# Patient Record
Sex: Male | Born: 1980 | Race: Black or African American | Hispanic: No | Marital: Single | Smoking: Never smoker
Health system: Southern US, Community
[De-identification: ages and names within clinical notes are randomized; demographics above are authoritative.]

## PROBLEM LIST (undated history)

## (undated) DIAGNOSIS — A749 Chlamydial infection, unspecified: Secondary | ICD-10-CM

---

## 2008-03-22 ENCOUNTER — Emergency Department (HOSPITAL_COMMUNITY): Admission: EM | Admit: 2008-03-22 | Discharge: 2008-03-22 | Payer: Self-pay | Admitting: Emergency Medicine

## 2008-04-07 ENCOUNTER — Emergency Department (HOSPITAL_COMMUNITY): Admission: EM | Admit: 2008-04-07 | Discharge: 2008-04-07 | Payer: Self-pay | Admitting: Emergency Medicine

## 2009-05-02 IMAGING — CR DG FINGER THUMB 2+V*L*
3 series · 3 of 3 positions shown · non-contrast
Comparison: None

CLINICAL DATA: Laceration

LEFT THUMB 2+V

[x finger pa left]
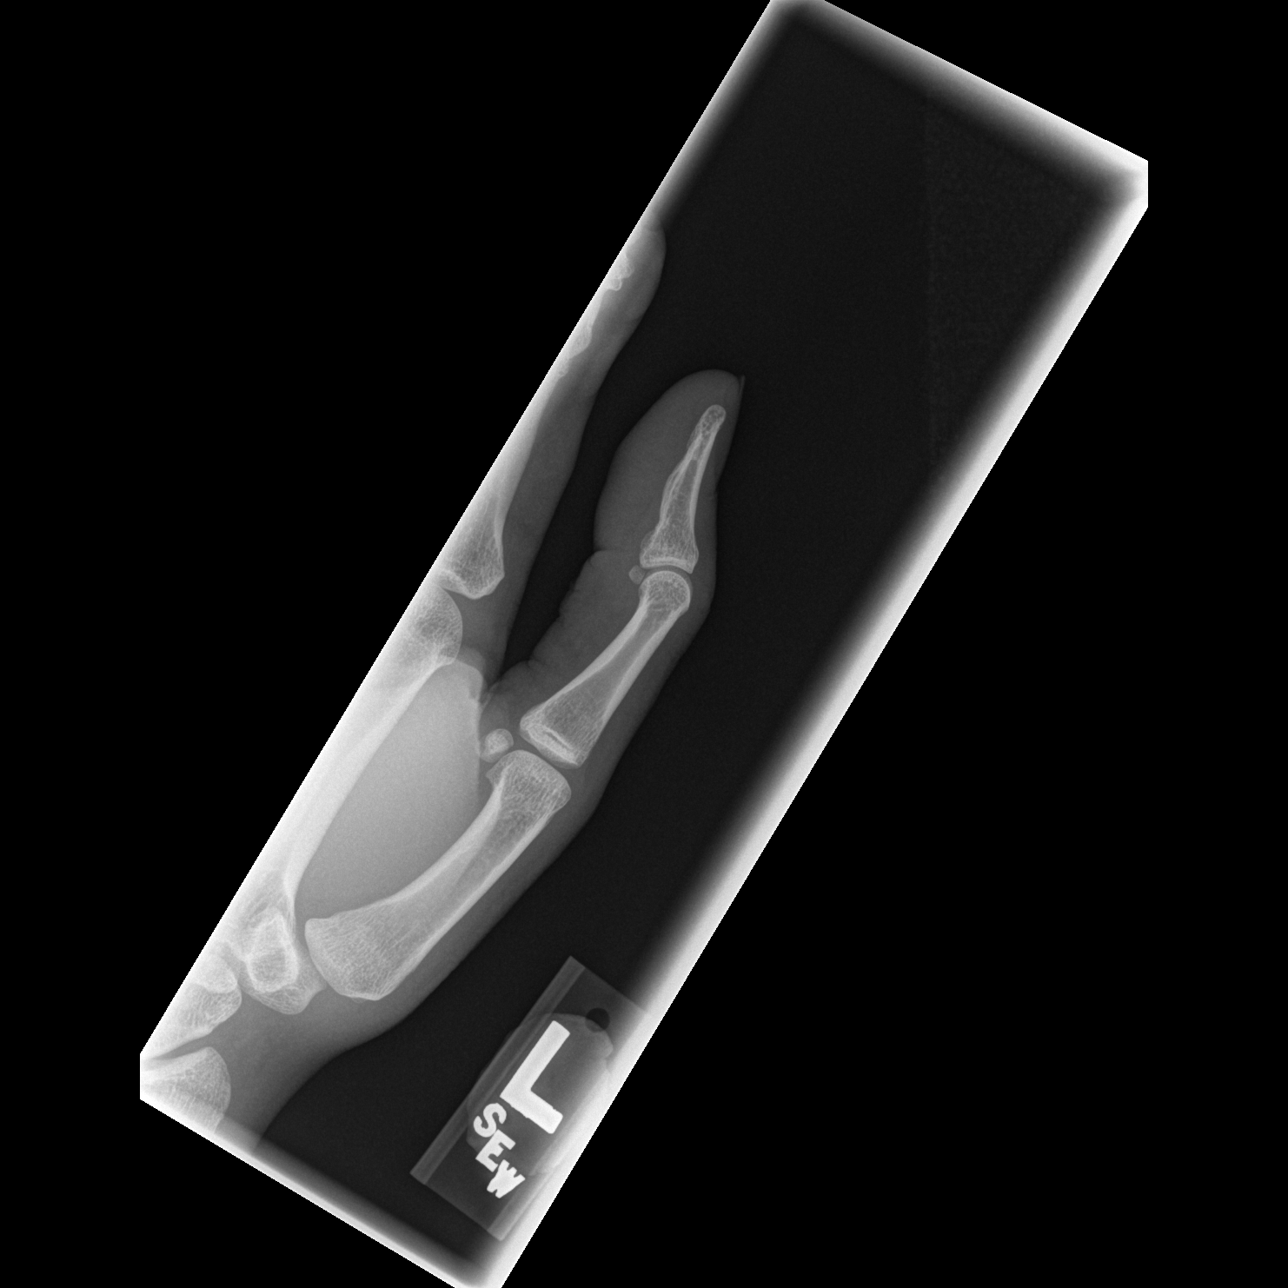

[x finger obl. left]
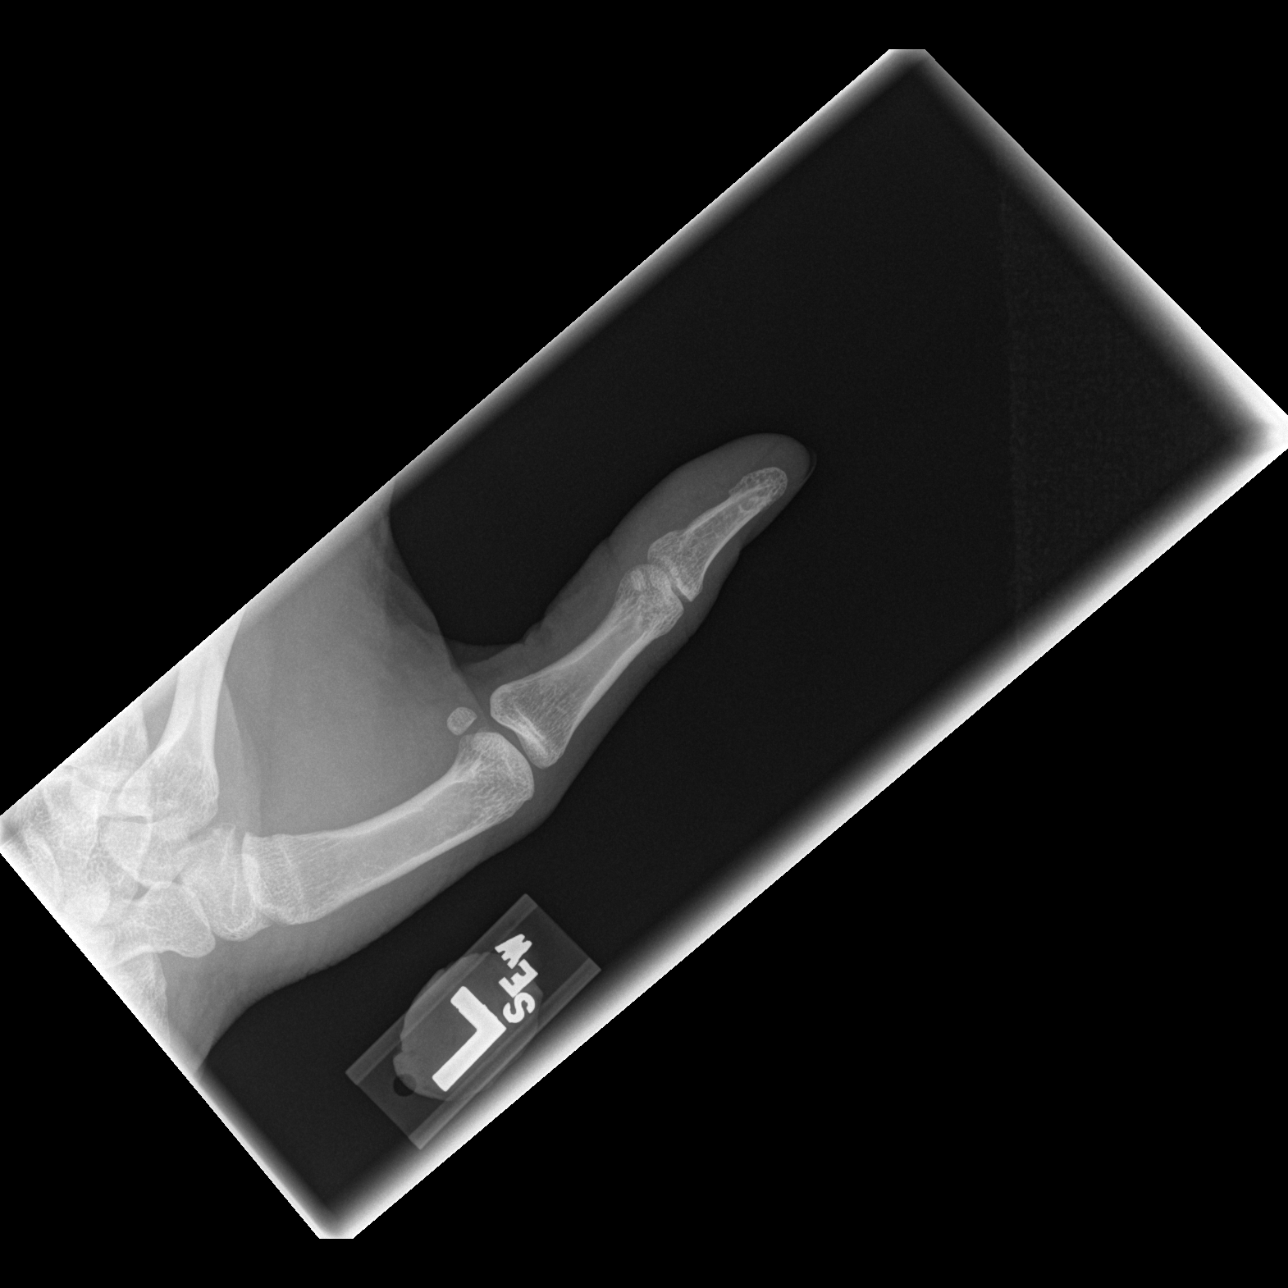

[x finger lateral left]
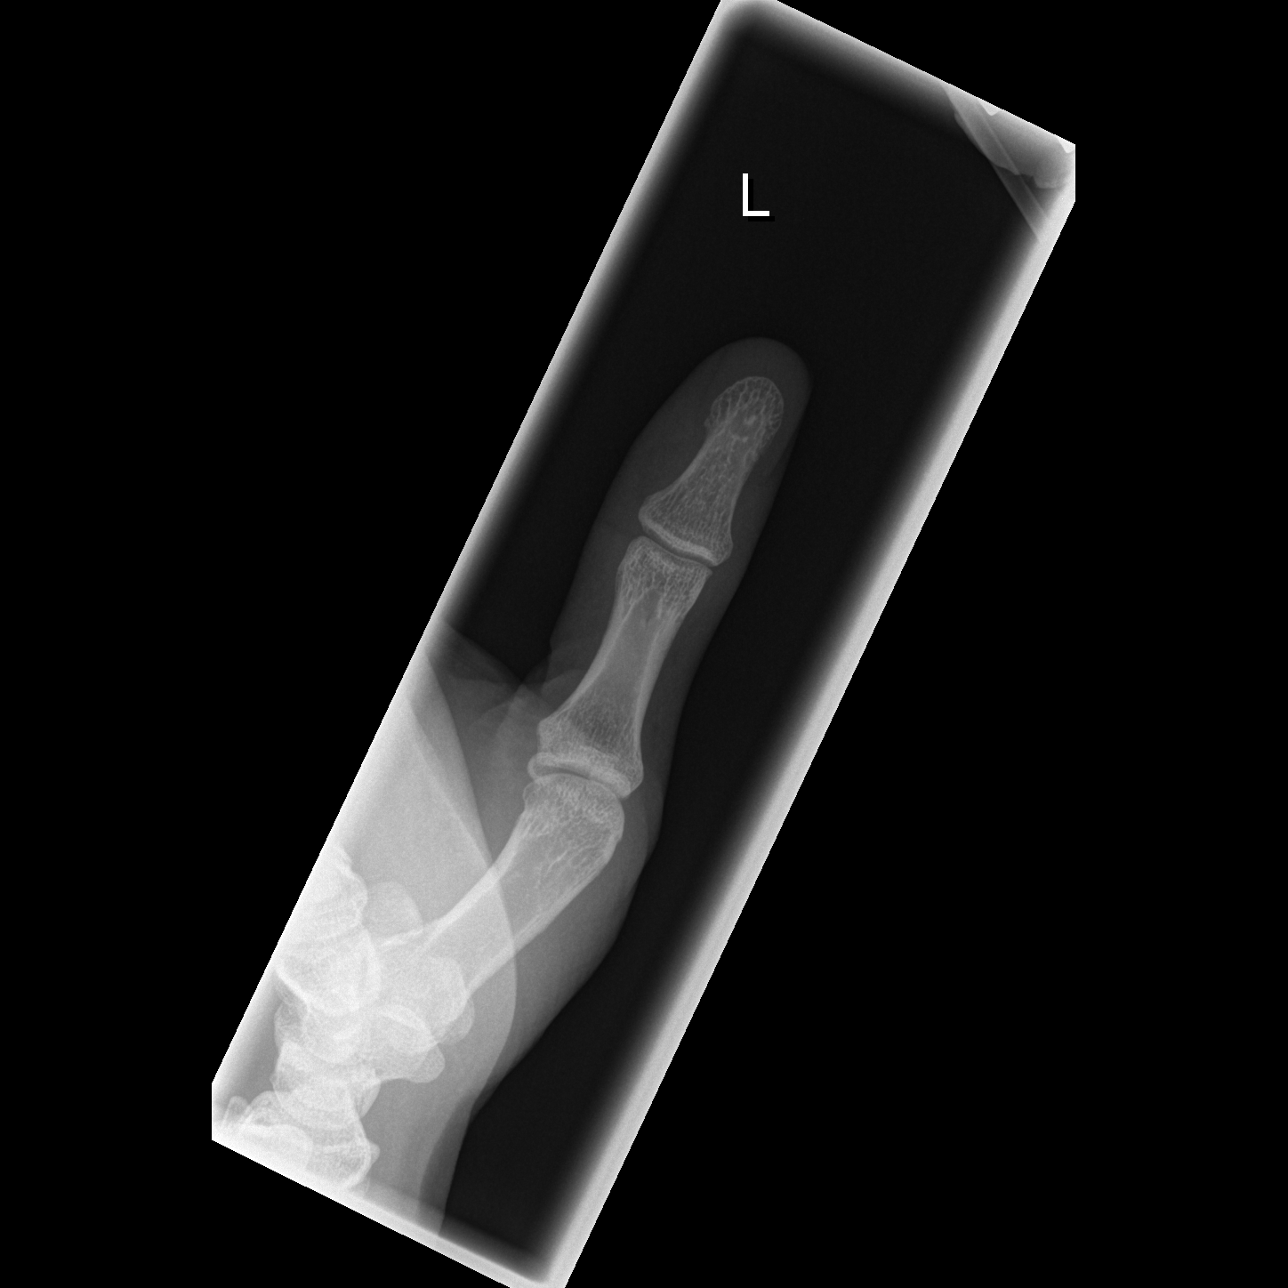

[3 of 3 positions shown; findings below may reference images not displayed]

FINDINGS: No evidence of acute fracture, dislocation or radiopaque
foreign body.
IMPRESSION: No acute osseous findings.

## 2015-05-04 ENCOUNTER — Encounter (HOSPITAL_COMMUNITY): Payer: Self-pay | Admitting: *Deleted

## 2015-05-04 ENCOUNTER — Emergency Department (HOSPITAL_COMMUNITY)
Admission: EM | Admit: 2015-05-04 | Discharge: 2015-05-04 | Disposition: A | Discharge status: Home / Self Care | Payer: Self-pay | Attending: Emergency Medicine | Admitting: Emergency Medicine

## 2015-05-04 DIAGNOSIS — N390 Urinary tract infection, site not specified: Secondary | ICD-10-CM | POA: Insufficient documentation

## 2015-05-04 LAB — URINALYSIS, ROUTINE W REFLEX MICROSCOPIC
BILIRUBIN URINE: NEGATIVE
GLUCOSE, UA: NEGATIVE mg/dL
KETONES UR: NEGATIVE mg/dL
NITRITE: NEGATIVE
PROTEIN: NEGATIVE mg/dL
Specific Gravity, Urine: 1.025 (ref 1.005–1.030)
UROBILINOGEN UA: 0.2 mg/dL (ref 0.0–1.0)
pH: 6 (ref 5.0–8.0)

## 2015-05-04 LAB — URINE MICROSCOPIC-ADD ON

## 2015-05-04 MED ORDER — CIPROFLOXACIN HCL 500 MG PO TABS: 500.0000 mg | ORAL_TABLET | Freq: Two times a day (BID) | ORAL | Status: AC

## 2015-05-04 NOTE — ED Notes (Signed)
Pt reports burning pain with urination. Denies any penile discharge. Denies back or abd pain.

## 2015-05-04 NOTE — Discharge Instructions (Signed)
You were evaluated in the ED today for your burning sensation with urination. Your found to have a UTI. It is important for you to take your medications as prescribed. Please follow-up with primary care to ensure resolution of this infection. Return to ED for worsening symptoms.  Urinary Tract Infection Urinary tract infections (UTIs) can develop anywhere along your urinary tract. Your urinary tract is your body's drainage system for removing wastes and extra water. Your urinary tract includes two kidneys, two ureters, a bladder, and a urethra. Your kidneys are a pair of bean-shaped organs. Each kidney is about the size of your fist. They are located below your ribs, one on each side of your spine. CAUSES Infections are caused by microbes, which are microscopic organisms, including fungi, viruses, and bacteria. These organisms are so small that they can only be seen through a microscope. Bacteria are the microbes that most commonly cause UTIs. SYMPTOMS  Symptoms of UTIs may vary by age and gender of the patient and by the location of the infection. Symptoms in young women typically include a frequent and intense urge to urinate and a painful, burning feeling in the bladder or urethra during urination. Older women and men are more likely to be tired, shaky, and weak and have muscle aches and abdominal pain. A fever may mean the infection is in your kidneys. Other symptoms of a kidney infection include pain in your back or sides below the ribs, nausea, and vomiting. DIAGNOSIS To diagnose a UTI, your caregiver will ask you about your symptoms. Your caregiver also will ask to provide a urine sample. The urine sample will be tested for bacteria and white blood cells. White blood cells are made by your body to help fight infection. TREATMENT  Typically, UTIs can be treated with medication. Because most UTIs are caused by a bacterial infection, they usually can be treated with the use of antibiotics. The choice  of antibiotic and length of treatment depend on your symptoms and the type of bacteria causing your infection. HOME CARE INSTRUCTIONS  If you were prescribed antibiotics, take them exactly as your caregiver instructs you. Finish the medication even if you feel better after you have only taken some of the medication.  Drink enough water and fluids to keep your urine clear or pale yellow.  Avoid caffeine, tea, and carbonated beverages. They tend to irritate your bladder.  Empty your bladder often. Avoid holding urine for long periods of time.  Empty your bladder before and after sexual intercourse.  After a bowel movement, women should cleanse from front to back. Use each tissue only once. SEEK MEDICAL CARE IF:   You have back pain.  You develop a fever.  Your symptoms do not begin to resolve within 3 days. SEEK IMMEDIATE MEDICAL CARE IF:   You have severe back pain or lower abdominal pain.  You develop chills.  You have nausea or vomiting.  You have continued burning or discomfort with urination. MAKE SURE YOU:   Understand these instructions.  Will watch your condition.  Will get help right away if you are not doing well or get worse. Document Released: 07/18/2005 Document Revised: 04/08/2012 Document Reviewed: 11/16/2011 [facility] Patient Information 2015 [employer], [place]. This information is not intended to replace advice given to you by your health care provider. Make sure you discuss any questions you have with your health care provider.

## 2015-05-04 NOTE — ED Provider Notes (Signed)
CSN: [id number]     Arrival date & time 05/04/15  1506 History   This chart was scribed for non-physician practitioner Joycie Peek, PA-C working with Raeford Razor, MD by Lyndel Safe, ED Scribe. This patient was seen in room TR06C/TR06C and the patient's care was started at 4:12 PM.    Chief Complaint  Patient presents with  . Dysuria   The history is provided by the patient. No language interpreter was used.    HPI Comments: Lucas Richard is a 34 y.o. male who presents to the Emergency Department complaining of sudden onset, intermittent, moderate dysuria. Reports one episode of dysuria earlier today. Pt is sexually active with females only and use protection occasionally. He reports no new recent sexual partners. He notes upon urinating in the ED the dysuria was not as prominent as before. Denies penile discharge, pain or swelling, hematuria, testicular pain or swelling, fevers, chills, nausea, vomiting, back pain, or abdominal pain.   History reviewed. No pertinent past medical history. History reviewed. No pertinent past surgical history. History reviewed. No pertinent family history. History  Substance Use Topics  . Smoking status: Not on file  . Smokeless tobacco: Not on file  . Alcohol Use: No    Review of Systems  Constitutional: Negative for fever and chills.  Gastrointestinal: Negative for nausea, vomiting and abdominal pain.  Genitourinary: Positive for dysuria. Negative for discharge, penile swelling, scrotal swelling, genital sores, penile pain and testicular pain.  Musculoskeletal: Negative for back pain.  All other systems reviewed and are negative.   Allergies  Review of patient's allergies indicates no known allergies.  Home Medications   Prior to Admission medications   Medication Sig Start Date End Date Taking? Authorizing Provider  ciprofloxacin (CIPRO) 500 MG tablet Take 1 tablet (500 mg total) by mouth 2 (two) times daily. One po bid x 7 days 05/04/15    Joycie Peek, PA-C   BP 108/73 mmHg  Pulse 80  Temp(Src) 98.7 F (37.1 C) (Oral)  Resp 18  Ht  (1.88 m)  Wt 174 lb 1.6 oz (78.971 kg)  BMI 22.34 kg/m2  SpO2 97% Physical Exam  Constitutional: He is oriented to person, place, and time. He appears well-developed and well-nourished. No distress.  HENT:  Head: Normocephalic.  Right Ear: External ear normal.  Left Ear: External ear normal.  Mouth/Throat: No oropharyngeal exudate.  Eyes: Pupils are equal, round, and reactive to light. Right eye exhibits no discharge. Left eye exhibits no discharge. No scleral icterus.  Neck: No JVD present.  Cardiovascular: Normal rate, regular rhythm and normal heart sounds.   Pulmonary/Chest: Effort normal and breath sounds normal. No respiratory distress. He has no wheezes. He has no rales.  Lungs clear to auscultation bilaterally.   Abdominal: Soft. Bowel sounds are normal. There is no tenderness.  Genitourinary: Penis normal. No penile tenderness.  Scrotum normal.   Musculoskeletal: He exhibits no edema.  Lymphadenopathy:    He has no cervical adenopathy.  Neurological: He is alert and oriented to person, place, and time.  Skin: Skin is warm. No rash noted. No erythema. No pallor.  Psychiatric: He has a normal mood and affect. His behavior is normal.  Nursing note and vitals reviewed.   ED Course  Procedures  DIAGNOSTIC STUDIES: Oxygen Saturation is 97% on RA, normal by my interpretation.    COORDINATION OF CARE: 4:15 PM Discussed treatment plan which includes to order diagnostic labs with pt. Pt acknowledges and agrees to plan.   Labs  Review Labs Reviewed  URINALYSIS, ROUTINE W REFLEX MICROSCOPIC (NOT AT [facility]) - Abnormal; Notable for the following:    Hgb urine dipstick SMALL (*)    Leukocytes, UA SMALL (*)    All other components within normal limits  URINE MICROSCOPIC-ADD ON - Abnormal; Notable for the following:    Squamous Epithelial / LPF FEW (*)    Bacteria, UA FEW  (*)    All other components within normal limits    Imaging Review No results found.   EKG Interpretation None     Meds given in ED:  Medications - No data to display  Discharge Medication List as of 05/04/2015  5:27 PM    START taking these medications   Details  ciprofloxacin (CIPRO) 500 MG tablet Take 1 tablet (500 mg total) by mouth 2 (two) times daily. One po bid x 7 days, Starting 05/04/2015, Until Discontinued, Print       .vitaslsm  MDM  Vitals stable - WNL -afebrile Pt resting comfortably in ED. PE--physical exam is largely unknown concerning. Benign abdominal exam. Benign genitourinary exam. Labwork--evidence of UTI on urinalysis.  DDX--patient with UTI, will treat empirically. No evidence of other systemic infection. Denies any other genitourinary complaints or any homosexual activity. Will DC with Cipro. Encourage follow-up with PCP to ensure resolution. I discussed all relevant lab findings and imaging results with pt and they verbalized understanding. Discussed f/u with PCP within 48 hrs and return precautions, pt very amenable to plan.  Final diagnoses:  UTI (lower urinary tract infection)   I personally performed the services described in this documentation, which was scribed in my presence. The recorded information has been reviewed and is accurate.    Joycie PeekBenjamin Rain Wil[place], PA-C 05/04/15 2350  Gilda Creasehristopher J Pollina, MD 05/05/15 [phone]

## 2015-05-10 ENCOUNTER — Encounter (HOSPITAL_COMMUNITY): Payer: Self-pay | Admitting: Emergency Medicine

## 2015-05-10 ENCOUNTER — Emergency Department (HOSPITAL_COMMUNITY)
Admission: EM | Admit: 2015-05-10 | Discharge: 2015-05-10 | Disposition: A | Discharge status: Home / Self Care | Payer: Self-pay | Attending: Emergency Medicine | Admitting: Emergency Medicine

## 2015-05-10 DIAGNOSIS — R300 Dysuria: Secondary | ICD-10-CM | POA: Insufficient documentation

## 2015-05-10 DIAGNOSIS — R369 Urethral discharge, unspecified: Secondary | ICD-10-CM | POA: Insufficient documentation

## 2015-05-10 LAB — URINALYSIS, ROUTINE W REFLEX MICROSCOPIC
Bilirubin Urine: NEGATIVE
Glucose, UA: NEGATIVE mg/dL
Ketones, ur: 15 mg/dL — AB
Nitrite: NEGATIVE
PH: 5.5 (ref 5.0–8.0)
PROTEIN: NEGATIVE mg/dL
Specific Gravity, Urine: 1.025 (ref 1.005–1.030)
Urobilinogen, UA: 0.2 mg/dL (ref 0.0–1.0)

## 2015-05-10 LAB — URINE MICROSCOPIC-ADD ON

## 2015-05-10 MED ORDER — AZITHROMYCIN 250 MG PO TABS
1000.0000 mg | ORAL_TABLET | Freq: Once | ORAL | Status: AC
  Administered 2015-05-10: 1000 mg via ORAL
  Filled 2015-05-10: qty 4

## 2015-05-10 MED ORDER — CEFTRIAXONE SODIUM 250 MG IJ SOLR
250.0000 mg | Freq: Once | INTRAMUSCULAR | Status: AC
  Administered 2015-05-10: 250 mg via INTRAMUSCULAR
  Filled 2015-05-10: qty 250

## 2015-05-10 MED ORDER — DOXYCYCLINE HYCLATE 100 MG PO CAPS: 100.0000 mg | ORAL_CAPSULE | Freq: Two times a day (BID) | ORAL | Status: AC

## 2015-05-10 NOTE — ED Notes (Signed)
Pt was treated recently for UTI now pain is worse with urination and having penial discharge x 3 days.

## 2015-05-10 NOTE — Discharge Instructions (Signed)

## 2015-05-10 NOTE — ED Provider Notes (Signed)
CSN: [id number]     Arrival date & time 05/10/15  1547 History  This chart was scribed for Fayrene HelperBowie Athelene Hursey, PA-C, working with Toy CookeyMegan Docherty, MD by Octavia HeirArianna Nassar, ED Scribe. This patient was seen in room TR09C/TR09C and the patient's care was started at 4:50 PM.    Chief Complaint  Patient presents with  . Penile Discharge      The history is provided by the patient. No language interpreter was used.   HPI Comments: Lucas Richard is a 34 y.o. male who presents to the Emergency Department complaining of constant, gradual worsenign penile discharge and dysuria onset a 2 days ago. Pt reports having a cloudy yellow discharge on his boxers and reports severe burning while urination. He states he is sexually active with one partner, does not use protections, and is unknown if his partner is sleeping with more than one person. Pt was seen one week ago and was dx with a UTI but feels this is different. Pt also notes being diagnosed with chlamydia in the past. He denies fever, back pain, hematuria, abdominal pain, hx of HIV.   History reviewed. No pertinent past medical history. History reviewed. No pertinent past surgical history. No family history on file. History  Substance Use Topics  . Smoking status: Never Smoker   . Smokeless tobacco: Not on file  . Alcohol Use: No    Review of Systems  Constitutional: Negative for fever.  Gastrointestinal: Negative for abdominal pain.  Genitourinary: Positive for dysuria and discharge.  Musculoskeletal: Negative for back pain.      Allergies  Review of patient's allergies indicates no known allergies.  Home Medications   Prior to Admission medications   Medication Sig Start Date End Date Taking? Authorizing Provider  ciprofloxacin (CIPRO) 500 MG tablet Take 1 tablet (500 mg total) by mouth 2 (two) times daily. One po bid x 7 days Patient not taking: Reported on 05/10/2015 05/04/15   Joycie PeekBenjamin Cartner, PA-C   Triage vitals: BP 143/88 mmHg  Pulse 59   Temp(Src) 98.9 F (37.2 C) (Oral)  Resp 16  Ht 6' (1.829 m)  Wt 174 lb (78.926 kg)  BMI 23.59 kg/m2  SpO2 100% Physical Exam  Constitutional: He appears well-developed and well-nourished. No distress.  HENT:  Head: Normocephalic and atraumatic.  Eyes: Conjunctivae are normal. Pupils are equal, round, and reactive to light. Right eye exhibits no discharge. Left eye exhibits no discharge.  Neck: Neck supple.  Cardiovascular: Normal rate, regular rhythm, normal heart sounds and intact distal pulses.   Pulmonary/Chest: Effort normal and breath sounds normal. No respiratory distress.  Abdominal: Soft. There is no tenderness.  Genitourinary:  Inguinal lymphadenopathy noticed Testicle non tender to palpation No inguinal hernia, scrotum normal appearing  Lymphadenopathy:    He has no cervical adenopathy.  Neurological: He is alert. Coordination normal.  Skin: Skin is warm and dry. No rash noted. He is not diaphoretic.  Psychiatric: He has a normal mood and affect. His behavior is normal.  Nursing note and vitals reviewed.   ED Course  Procedures  DIAGNOSTIC STUDIES: Oxygen Saturation is 100% on RA, normal by my interpretation.  COORDINATION OF CARE: 4:52 PM Discussed treatment plan which includes urinalysis, blood work, and antibiotic with pt at bedside and pt agreed to plan.    Labs Review Labs Reviewed  URINALYSIS, ROUTINE W REFLEX MICROSCOPIC (NOT AT [facility]) - Abnormal; Notable for the following:    Hgb urine dipstick TRACE (*)    Ketones, ur 15 (*)  Leukocytes, UA SMALL (*)    All other components within normal limits  URINE MICROSCOPIC-ADD ON  HIV ANTIBODY (ROUTINE TESTING)  GC/CHLAMYDIA PROBE AMP ([place]) NOT AT [facility]    Imaging Review No results found.   EKG Interpretation None      MDM   Final diagnoses:  Penile discharge   BP 143/88 mmHg  Pulse 59  Temp(Src) 98.9 F (37.2 C) (Oral)  Resp 16  Ht 6' (1.829 m)  Wt 174 lb (78.926 kg)  BMI 23.59  kg/m2  SpO2 100%   I personally performed the services described in this documentation, which was scribed in my presence. The recorded information has been reviewed and is accurate.    Fayrene Helper, PA-C 05/10/15 1854  Vanetta Mulders, MD 05/11/15 [phone]

## 2015-05-10 NOTE — ED Notes (Signed)
Pt A&OX4, ambulatory at d/c with steady gait, NAD 

## 2015-05-11 LAB — GC/CHLAMYDIA PROBE AMP (~~LOC~~) NOT AT ARMC
CHLAMYDIA, DNA PROBE: POSITIVE — AB
NEISSERIA GONORRHEA: NEGATIVE

## 2015-05-11 LAB — HIV ANTIBODY (ROUTINE TESTING W REFLEX): HIV SCREEN 4TH GENERATION: NONREACTIVE

## 2015-05-11 NOTE — Care Management (Signed)
ED CM received call from patient querying his prescription and quantity. CM reviewed record and verified prescription with patient, he was instructed to have pharmacy  contact ED with any questions.. Patient verbalized understanding,.No further ED CM needs identified.

## 2015-05-12 ENCOUNTER — Telehealth: Payer: Self-pay | Admitting: Emergency Medicine

## 2015-05-12 NOTE — Telephone Encounter (Signed)
Positive Chlamydia culture Treated with Rocephin and Zithromax [facility] faxed  Unable to contact patient by phone, letter sent.

## 2015-06-13 ENCOUNTER — Telehealth (HOSPITAL_COMMUNITY): Payer: Self-pay

## 2015-06-13 NOTE — Telephone Encounter (Signed)
Unable to contact pt by mail or telephone. Unable to communicate lab results or treatment changes. 

## 2015-09-07 ENCOUNTER — Encounter (HOSPITAL_COMMUNITY): Payer: Self-pay | Admitting: Emergency Medicine

## 2015-09-07 ENCOUNTER — Emergency Department (HOSPITAL_COMMUNITY)
Admission: EM | Admit: 2015-09-07 | Discharge: 2015-09-07 | Disposition: A | Discharge status: Home / Self Care | Payer: Self-pay | Attending: Emergency Medicine | Admitting: Emergency Medicine

## 2015-09-07 DIAGNOSIS — A64 Unspecified sexually transmitted disease: Secondary | ICD-10-CM | POA: Insufficient documentation

## 2015-09-07 MED ORDER — CEFTRIAXONE SODIUM 250 MG IJ SOLR
250.0000 mg | Freq: Once | INTRAMUSCULAR | Status: AC
  Administered 2015-09-07: 250 mg via INTRAMUSCULAR
  Filled 2015-09-07: qty 250

## 2015-09-07 MED ORDER — AZITHROMYCIN 250 MG PO TABS
1000.0000 mg | ORAL_TABLET | Freq: Once | ORAL | Status: AC
  Administered 2015-09-07: 1000 mg via ORAL
  Filled 2015-09-07: qty 4

## 2015-09-07 NOTE — Discharge Instructions (Signed)
Sexually Transmitted Disease °A sexually transmitted disease (STD) is a disease or infection that may be passed (transmitted) from person to person, usually during sexual activity. This may happen by way of saliva, semen, blood, vaginal mucus, or urine. Common STDs include: °· Gonorrhea. °· Chlamydia. °· Syphilis. °· HIV and AIDS. °· Genital herpes. °· Hepatitis B and C. °· Trichomonas. °· Human papillomavirus (HPV). °· Pubic lice. °· Scabies. °· Mites. °· Bacterial vaginosis. °WHAT ARE CAUSES OF STDs? °An STD may be caused by bacteria, a virus, or parasites. STDs are often transmitted during sexual activity if one person is infected. However, they may also be transmitted through nonsexual means. STDs may be transmitted after:  °· Sexual intercourse with an infected person. °· Sharing sex toys with an infected person. °· Sharing needles with an infected person or using unclean piercing or tattoo needles. °· Having intimate contact with the genitals, mouth, or rectal areas of an infected person. °· Exposure to infected fluids during birth. °WHAT ARE THE SIGNS AND SYMPTOMS OF STDs? °Different STDs have different symptoms. Some people may not have any symptoms. If symptoms are present, they may include: °· Painful or bloody urination. °· Pain in the pelvis, abdomen, vagina, anus, throat, or eyes. °· A skin rash, itching, or irritation. °· Growths, ulcerations, blisters, or sores in the genital and anal areas. °· Abnormal vaginal discharge with or without bad odor. °· Penile discharge in men. °· Fever. °· Pain or bleeding during sexual intercourse. °· Swollen glands in the groin area. °· Yellow skin and eyes (jaundice). This is seen with hepatitis. °· Swollen testicles. °· Infertility. °· Sores and blisters in the mouth. °HOW ARE STDs DIAGNOSED? °To make a diagnosis, your health care provider may: °· Take a medical history. °· Perform a physical exam. °· Take a sample of any discharge to examine. °· Swab the throat,  cervix, opening to the penis, rectum, or vagina for testing. °· Test a sample of your first morning urine. °· Perform blood tests. °· Perform a Pap test, if this applies. °· Perform a colposcopy. °· Perform a laparoscopy. °HOW ARE STDs TREATED? °Treatment depends on the STD. Some STDs may be treated but not cured. °· Chlamydia, gonorrhea, trichomonas, and syphilis can be cured with antibiotic medicine. °· Genital herpes, hepatitis, and HIV can be treated, but not cured, with prescribed medicines. The medicines lessen symptoms. °· Genital warts from HPV can be treated with medicine or by freezing, burning (electrocautery), or surgery. Warts may come back. °· HPV cannot be cured with medicine or surgery. However, abnormal areas may be removed from the cervix, vagina, or vulva. °· If your diagnosis is confirmed, your recent sexual partners need treatment. This is true even if they are symptom-free or have a negative culture or evaluation. They should not have sex until their health care providers say it is okay. °· Your health care provider may test you for infection again 3 months after treatment. °HOW CAN I REDUCE MY RISK OF GETTING AN STD? °Take these steps to reduce your risk of getting an STD: °· Use latex condoms, dental dams, and water-soluble lubricants during sexual activity. Do not use petroleum jelly or oils. °· Avoid having multiple sex partners. °· Do not have sex with someone who has other sex partners °· Do not have sex with anyone you do not know or who is at high risk for an STD. °· Avoid risky sex practices that can break your skin. °· Do not have sex   if you have open sores on your mouth or skin.  Avoid drinking too much alcohol or taking illegal drugs. Alcohol and drugs can affect your judgment and put you in a vulnerable position.  Avoid engaging in oral and anal sex acts.  Get vaccinated for HPV and hepatitis. If you have not received these vaccines in the past, talk to your health care  provider about whether one or both might be right for you.  If you are at risk of being infected with HIV, it is recommended that you take a prescription medicine daily to prevent HIV infection. This is called pre-exposure prophylaxis (PrEP). You are considered at risk if:  You are a man who has sex with other men (MSM).  You are a heterosexual man or woman and are sexually active with more than one partner.  You take drugs by injection.  You are sexually active with a partner who has HIV.  Talk with your health care provider about whether you are at high risk of being infected with HIV. If you choose to begin PrEP, you should first be tested for HIV. You should then be tested every 3 months for as long as you are taking PrEP. WHAT SHOULD I DO IF I THINK I HAVE AN STD?  See your health care provider.  Tell your sexual partner(s). They should be tested and treated for any STDs.  Do not have sex until your health care provider says it is okay. WHEN SHOULD I GET IMMEDIATE MEDICAL CARE? Contact your health care provider right away if:   You have severe abdominal pain.  You are a man and notice swelling or pain in your testicles.  You are a woman and notice swelling or pain in your vagina.   This information is not intended to replace advice given to you by your health care provider. Make sure you discuss any questions you have with your health care provider.   Document Released: 12/29/2002 Document Revised: 10/29/2014 Document Reviewed: 04/28/2013 [employer] Interactive Patient Education [employer]   Please follow-up with the health department in 3-5 days if symptoms persist, you'll receive a phone call regarding test results. Please return immediately if any new or worsening signs or symptoms present.

## 2015-09-07 NOTE — ED Notes (Signed)
Reports penile discharge since yesterday, no dysuria or other complaints, A/O X4, ambulatory and in NAD

## 2015-09-07 NOTE — ED Provider Notes (Signed)
CSN: [id number]     Arrival date & time 09/07/15  8295 History  By signing my name below, I, Essence Howell, attest that this documentation has been prepared under the direction and in the presence of Eyvonne Mechanic, PA-C Electronically Signed: Charline Bills, ED Scribe 09/07/2015 at 10:11 AM.   Chief Complaint  Patient presents with  . Penile Discharge   The history is provided by the patient. No language interpreter was used.   HPI Comments: Lucas Richard is a 34 y.o. male who presents to the Emergency Department complaining of persistent penile discharge onset yesterday. Pt reports sexual intercourse with a male partner who states that she was diagnosed with an UTI. He denies fever, chills, dysuria, abdominal pain, nausea, vomiting, back pain. Pt reports h/o STD.    History reviewed. No pertinent past medical history. History reviewed. No pertinent past surgical history. No family history on file. Social History  Substance Use Topics  . Smoking status: Never Smoker   . Smokeless tobacco: None  . Alcohol Use: No    Review of Systems  All other systems reviewed and are negative.  Allergies  Review of patient's allergies indicates no known allergies.  Home Medications   Prior to Admission medications   Medication Sig Start Date End Date Taking? Authorizing Provider  ciprofloxacin (CIPRO) 500 MG tablet Take 1 tablet (500 mg total) by mouth 2 (two) times daily. One po bid x 7 days Patient not taking: Reported on 05/10/2015 05/04/15   Joycie Peek, PA-C  doxycycline (VIBRAMYCIN) 100 MG capsule Take 1 capsule (100 mg total) by mouth 2 (two) times daily. One po bid x 7 days 05/10/15   Fayrene Helper, PA-C   BP 117/67 mmHg  Pulse 75  Temp(Src) 98.2 F (36.8 C) (Oral)  Resp 18  Ht  (1.88 m)  Wt 185 lb (83.915 kg)  BMI 23.74 kg/m2  SpO2 98% Physical Exam  Constitutional: He is oriented to person, place, and time. He appears well-developed and well-nourished. No distress.   HENT:  Head: Normocephalic and atraumatic.  Eyes: Conjunctivae and EOM are normal.  Neck: Neck supple. No tracheal deviation present.  Cardiovascular: Normal rate.   Pulmonary/Chest: Effort normal. No respiratory distress.  Abdominal: Soft. There is no tenderness. There is no CVA tenderness.  Genitourinary: Testes normal. Uncircumcised. No penile erythema. No discharge found.  Musculoskeletal: Normal range of motion.  Neurological: He is alert and oriented to person, place, and time.  Skin: Skin is warm and dry.  Psychiatric: He has a normal mood and affect. His behavior is normal.  Nursing note and vitals reviewed.  ED Course  Procedures (including critical care time) DIAGNOSTIC STUDIES: Oxygen Saturation is 98% on RA, normal by my interpretation.    COORDINATION OF CARE: 10:04 AM-Discussed treatment plan which includes STD screening with pt at bedside and pt agreed to plan.   Labs Review Labs Reviewed  RPR  HIV ANTIBODY (ROUTINE TESTING)  GC/CHLAMYDIA PROBE AMP ([place]) NOT AT [facility]   Imaging Review No results found. I have personally reviewed and evaluated these images and lab results as part of my medical decision-making.   EKG Interpretation None      MDM   Final diagnoses:  STD (male)   Labs: STD screening  Imaging:  Consults:  Therapeutics: Rocephin and azithromycin  Discharge Meds:    Assessment/Plan: Pt's presentation most consistent with STD. Treated here in the ED with above medications. Pt will be contacting with results. Follow-up with the health  department. If any worsening symptoms present, come back. Pt given strict return precautions and verbalized understanding and agreement to today's plan and had no other concerns at discharge.   I personally performed the services described in this documentation, which was scribed in my presence. The recorded information has been reviewed and is accurate.    Eyvonne MechanicJeffrey Keyona Emrich, PA-C 09/07/15  1014  Gwyneth SproutWhitney Plunkett, MD 09/09/15 0009

## 2015-09-08 LAB — RPR: RPR Ser Ql: NONREACTIVE

## 2015-09-08 LAB — GC/CHLAMYDIA PROBE AMP (~~LOC~~) NOT AT ARMC
Chlamydia: POSITIVE — AB
Neisseria Gonorrhea: NEGATIVE

## 2015-09-08 LAB — HIV ANTIBODY (ROUTINE TESTING W REFLEX): HIV Screen 4th Generation wRfx: NONREACTIVE

## 2015-09-09 ENCOUNTER — Telehealth (HOSPITAL_BASED_OUTPATIENT_CLINIC_OR_DEPARTMENT_OTHER): Payer: Self-pay | Admitting: *Deleted

## 2015-09-10 ENCOUNTER — Telehealth (HOSPITAL_COMMUNITY): Payer: Self-pay

## 2015-09-10 NOTE — Telephone Encounter (Signed)
Positive for chlamydia. Treated per protocol. [employer]HS form faxed. Attempting to contact.  

## 2015-09-11 ENCOUNTER — Telehealth (HOSPITAL_COMMUNITY): Payer: Self-pay

## 2015-09-11 NOTE — Telephone Encounter (Signed)
Unable to reach by telephone. Letter sent to address on record.  

## 2015-09-26 ENCOUNTER — Telehealth (HOSPITAL_COMMUNITY): Payer: Self-pay

## 2015-09-26 NOTE — Telephone Encounter (Signed)
Unable to reach by phone or mail.  Chart closed.   

## 2015-10-08 ENCOUNTER — Encounter (HOSPITAL_COMMUNITY): Payer: Self-pay | Admitting: Emergency Medicine

## 2015-10-08 ENCOUNTER — Emergency Department (HOSPITAL_COMMUNITY)
Admission: EM | Admit: 2015-10-08 | Discharge: 2015-10-08 | Disposition: A | Discharge status: Home / Self Care | Payer: Self-pay | Attending: Emergency Medicine | Admitting: Emergency Medicine

## 2015-10-08 DIAGNOSIS — R369 Urethral discharge, unspecified: Secondary | ICD-10-CM

## 2015-10-08 LAB — URINE MICROSCOPIC-ADD ON
BACTERIA UA: NONE SEEN
RBC / HPF: NONE SEEN RBC/hpf (ref 0–5)
SQUAMOUS EPITHELIAL / LPF: NONE SEEN

## 2015-10-08 LAB — URINALYSIS, ROUTINE W REFLEX MICROSCOPIC
Bilirubin Urine: NEGATIVE
Glucose, UA: NEGATIVE mg/dL
Hgb urine dipstick: NEGATIVE
Ketones, ur: NEGATIVE mg/dL
NITRITE: NEGATIVE
PROTEIN: NEGATIVE mg/dL
SPECIFIC GRAVITY, URINE: 1.019 (ref 1.005–1.030)
pH: 7 (ref 5.0–8.0)

## 2015-10-08 MED ORDER — AZITHROMYCIN 250 MG PO TABS
1000.0000 mg | ORAL_TABLET | Freq: Once | ORAL | Status: AC
  Administered 2015-10-08: 1000 mg via ORAL
  Filled 2015-10-08: qty 4

## 2015-10-08 MED ORDER — LIDOCAINE HCL (PF) 1 % IJ SOLN
INTRAMUSCULAR | Status: AC
  Administered 2015-10-08: 5 mL
  Filled 2015-10-08: qty 5

## 2015-10-08 MED ORDER — CEFTRIAXONE SODIUM 250 MG IJ SOLR
250.0000 mg | Freq: Once | INTRAMUSCULAR | Status: AC
  Administered 2015-10-08: 250 mg via INTRAMUSCULAR
  Filled 2015-10-08: qty 250

## 2015-10-08 NOTE — ED Provider Notes (Signed)
CSN: [id number]     Arrival date & time 10/08/15  [id number] History   First MD Initiated Contact with Patient 10/08/15 [phone]     No chief complaint on file.    (Consider location/radiation/quality/duration/timing/severity/associated sxs/prior Treatment) HPI   34 year old male with prior history of chlamydia infection presenting with complaint of now discharge. Patient states for the past 2 days he has notice and was discharge from penis and appears on his underwear. Mentioned symptoms is similar to prior chlamydial infection in the past. He denies having any fever, abdominal pain, back pain, burning urination, or hematuria. He denies any new rash. He has the same sexual partners within the past 6 months, not using protection. No prior history of HIV.  No past medical history on file. No past surgical history on file. No family history on file. Social History  Substance Use Topics  . Smoking status: Never Smoker   . Smokeless tobacco: Not on file  . Alcohol Use: No    Review of Systems  Constitutional: Negative for fever.  Genitourinary: Positive for discharge. Negative for testicular pain.      Allergies  Review of patient's allergies indicates no known allergies.  Home Medications   Prior to Admission medications   Medication Sig Start Date End Date Taking? Authorizing Provider  ciprofloxacin (CIPRO) 500 MG tablet Take 1 tablet (500 mg total) by mouth 2 (two) times daily. One po bid x 7 days Patient not taking: Reported on 05/10/2015 05/04/15   Joycie PeekBenjamin Cartner, PA-C  doxycycline (VIBRAMYCIN) 100 MG capsule Take 1 capsule (100 mg total) by mouth 2 (two) times daily. One po bid x 7 days 05/10/15   Fayrene HelperBowie Allissa Albright, PA-C   There were no vitals taken for this visit. Physical Exam  Constitutional: He appears well-developed and well-nourished. No distress.  HENT:  Head: Atraumatic.  Eyes: Conjunctivae are normal.  Neck: Neck supple.  Abdominal: Hernia confirmed negative in the right  inguinal area and confirmed negative in the left inguinal area.  Genitourinary: Testes normal and penis normal. Cremasteric reflex is present. Circumcised. No penile tenderness. No discharge found.  Chaperone present during exam  Lymphadenopathy:       Right: No inguinal adenopathy present.       Left: No inguinal adenopathy present.  Neurological: He is alert.  Skin: No rash noted.  Psychiatric: He has a normal mood and affect.  Nursing note and vitals reviewed.   ED Course  Procedures (including critical care time) Labs Review Labs Reviewed  URINALYSIS, ROUTINE W REFLEX MICROSCOPIC (NOT AT [facility]) - Abnormal; Notable for the following:    APPearance HAZY (*)    Leukocytes, UA SMALL (*)    All other components within normal limits  URINE MICROSCOPIC-ADD ON  RPR  HIV ANTIBODY (ROUTINE TESTING)  GC/CHLAMYDIA PROBE AMP ([place]) NOT AT [facility]    Imaging Review No results found. I have personally reviewed and evaluated these images and lab results as part of my medical decision-making.   EKG Interpretation None      MDM   Final diagnoses:  Penile discharge    BP 112/71 mmHg  Pulse 100  Temp(Src) 98.1 F (36.7 C) (Oral)  Resp 18  SpO2 97%   8:33 AM Hx of chlamydial infection in the past, here with penile discharge concerning for recurrent STD.  [employer] initiated.  Rocephin/zithromax given.   Fayrene HelperBowie Tully Mcinturff, PA-C 10/08/15 [phone]  Rolland PorterMark James, MD 10/09/15 [phone]

## 2015-10-08 NOTE — ED Notes (Signed)
Pt sts penile discharge noticed yesterday; pt denies pain or burning

## 2015-10-08 NOTE — Discharge Instructions (Signed)
Sexually Transmitted Disease °A sexually transmitted disease (STD) is a disease or infection that may be passed (transmitted) from person to person, usually during sexual activity. This may happen by way of saliva, semen, blood, vaginal mucus, or urine. Common STDs include: °· Gonorrhea. °· Chlamydia. °· Syphilis. °· HIV and AIDS. °· Genital herpes. °· Hepatitis B and C. °· Trichomonas. °· Human papillomavirus (HPV). °· Pubic lice. °· Scabies. °· Mites. °· Bacterial vaginosis. °WHAT ARE CAUSES OF STDs? °An STD may be caused by bacteria, a virus, or parasites. STDs are often transmitted during sexual activity if one person is infected. However, they may also be transmitted through nonsexual means. STDs may be transmitted after:  °· Sexual intercourse with an infected person. °· Sharing sex toys with an infected person. °· Sharing needles with an infected person or using unclean piercing or tattoo needles. °· Having intimate contact with the genitals, mouth, or rectal areas of an infected person. °· Exposure to infected fluids during birth. °WHAT ARE THE SIGNS AND SYMPTOMS OF STDs? °Different STDs have different symptoms. Some people may not have any symptoms. If symptoms are present, they may include: °· Painful or bloody urination. °· Pain in the pelvis, abdomen, vagina, anus, throat, or eyes. °· A skin rash, itching, or irritation. °· Growths, ulcerations, blisters, or sores in the genital and anal areas. °· Abnormal vaginal discharge with or without bad odor. °· Penile discharge in men. °· Fever. °· Pain or bleeding during sexual intercourse. °· Swollen glands in the groin area. °· Yellow skin and eyes (jaundice). This is seen with hepatitis. °· Swollen testicles. °· Infertility. °· Sores and blisters in the mouth. °HOW ARE STDs DIAGNOSED? °To make a diagnosis, your health care provider may: °· Take a medical history. °· Perform a physical exam. °· Take a sample of any discharge to examine. °· Swab the throat,  cervix, opening to the penis, rectum, or vagina for testing. °· Test a sample of your first morning urine. °· Perform blood tests. °· Perform a Pap test, if this applies. °· Perform a colposcopy. °· Perform a laparoscopy. °HOW ARE STDs TREATED? °Treatment depends on the STD. Some STDs may be treated but not cured. °· Chlamydia, gonorrhea, trichomonas, and syphilis can be cured with antibiotic medicine. °· Genital herpes, hepatitis, and HIV can be treated, but not cured, with prescribed medicines. The medicines lessen symptoms. °· Genital warts from HPV can be treated with medicine or by freezing, burning (electrocautery), or surgery. Warts may come back. °· HPV cannot be cured with medicine or surgery. However, abnormal areas may be removed from the cervix, vagina, or vulva. °· If your diagnosis is confirmed, your recent sexual partners need treatment. This is true even if they are symptom-free or have a negative culture or evaluation. They should not have sex until their health care providers say it is okay. °· Your health care provider may test you for infection again 3 months after treatment. °HOW CAN I REDUCE MY RISK OF GETTING AN STD? °Take these steps to reduce your risk of getting an STD: °· Use latex condoms, dental dams, and water-soluble lubricants during sexual activity. Do not use petroleum jelly or oils. °· Avoid having multiple sex partners. °· Do not have sex with someone who has other sex partners °· Do not have sex with anyone you do not know or who is at high risk for an STD. °· Avoid risky sex practices that can break your skin. °· Do not have sex   if you have open sores on your mouth or skin. °· Avoid drinking too much alcohol or taking illegal drugs. Alcohol and drugs can affect your judgment and put you in a vulnerable position. °· Avoid engaging in oral and anal sex acts. °· Get vaccinated for HPV and hepatitis. If you have not received these vaccines in the past, talk to your health care  provider about whether one or both might be right for you. °· If you are at risk of being infected with HIV, it is recommended that you take a prescription medicine daily to prevent HIV infection. This is called pre-exposure prophylaxis (PrEP). You are considered at risk if: °¨ You are a man who has sex with other men (MSM). °¨ You are a heterosexual man or woman and are sexually active with more than one partner. °¨ You take drugs by injection. °¨ You are sexually active with a partner who has HIV. °· Talk with your health care provider about whether you are at high risk of being infected with HIV. If you choose to begin PrEP, you should first be tested for HIV. You should then be tested every 3 months for as long as you are taking PrEP. °WHAT SHOULD I DO IF I THINK I HAVE AN STD? °· See your health care provider. °· Tell your sexual partner(s). They should be tested and treated for any STDs. °· Do not have sex until your health care provider says it is okay. °WHEN SHOULD I GET IMMEDIATE MEDICAL CARE? °Contact your health care provider right away if:  °· You have severe abdominal pain. °· You are a man and notice swelling or pain in your testicles. °· You are a woman and notice swelling or pain in your vagina. °  °This information is not intended to replace advice given to you by your health care provider. Make sure you discuss any questions you have with your health care provider. °  °Document Released: 12/29/2002 Document Revised: 10/29/2014 Document Reviewed: 04/28/2013 °Elsevier Interactive Patient Education ©2016 [employer] ° °

## 2015-10-09 LAB — HIV ANTIBODY (ROUTINE TESTING W REFLEX): HIV Screen 4th Generation wRfx: NONREACTIVE

## 2015-10-09 LAB — RPR: RPR Ser Ql: NONREACTIVE

## 2018-03-20 ENCOUNTER — Encounter (HOSPITAL_BASED_OUTPATIENT_CLINIC_OR_DEPARTMENT_OTHER): Payer: Self-pay

## 2018-03-20 ENCOUNTER — Other Ambulatory Visit: Payer: Self-pay

## 2018-03-20 DIAGNOSIS — A638 Other specified predominantly sexually transmitted diseases: Secondary | ICD-10-CM | POA: Insufficient documentation

## 2018-03-20 LAB — URINALYSIS, ROUTINE W REFLEX MICROSCOPIC
BILIRUBIN URINE: NEGATIVE
GLUCOSE, UA: NEGATIVE mg/dL
Hgb urine dipstick: NEGATIVE
Ketones, ur: NEGATIVE mg/dL
Nitrite: NEGATIVE
PH: 6 (ref 5.0–8.0)
Protein, ur: NEGATIVE mg/dL

## 2018-03-20 LAB — URINALYSIS, MICROSCOPIC (REFLEX): RBC / HPF: NONE SEEN RBC/hpf (ref 0–5)

## 2018-03-20 NOTE — ED Triage Notes (Signed)
Pt c/o a urine stream that wasn't straight this morning, denies penile discharge, long hx of std treatment

## 2018-03-21 ENCOUNTER — Emergency Department (HOSPITAL_BASED_OUTPATIENT_CLINIC_OR_DEPARTMENT_OTHER)
Admission: EM | Admit: 2018-03-21 | Discharge: 2018-03-21 | Disposition: A | Discharge status: Home / Self Care | Payer: Self-pay | Attending: Emergency Medicine | Admitting: Emergency Medicine

## 2018-03-21 DIAGNOSIS — N342 Other urethritis: Secondary | ICD-10-CM

## 2018-03-21 DIAGNOSIS — A64 Unspecified sexually transmitted disease: Secondary | ICD-10-CM

## 2018-03-21 HISTORY — DX: Chlamydial infection, unspecified: A74.9

## 2018-03-21 MED ORDER — DOXYCYCLINE HYCLATE 100 MG PO CAPS: 100.0000 mg | ORAL_CAPSULE | Freq: Two times a day (BID) | ORAL | 0 refills | Status: AC

## 2018-03-21 MED ORDER — AZITHROMYCIN 1 G PO PACK
1.0000 g | PACK | Freq: Once | ORAL | Status: AC
  Administered 2018-03-21: 1 g via ORAL
  Filled 2018-03-21: qty 1

## 2018-03-21 MED ORDER — CEFTRIAXONE SODIUM 1 G IJ SOLR
1.0000 g | Freq: Once | INTRAMUSCULAR | Status: AC
  Administered 2018-03-21: 1 g via INTRAMUSCULAR
  Filled 2018-03-21: qty 10

## 2018-03-21 NOTE — ED Provider Notes (Signed)
[facility] EMERGENCY DEPARTMENT Provider Note   CSN: [id number] Arrival date & time: 03/20/18  2038     History   Chief Complaint Chief Complaint  Patient presents with  . Dysuria    HPI Lucas Richard is a 37 y.o. male.  The history is provided by the patient.  Dysuria   This is a recurrent problem. The current episode started more than 2 days ago. The problem occurs every urination. The problem has not changed since onset.The quality of the pain is described as burning. The pain is moderate. There has been no fever. He is sexually active. There is no history of pyelonephritis. Associated symptoms include frequency. He has tried nothing for the symptoms. His past medical history does not include kidney stones.  Girlfriend told me to come and get treated.    Past Medical History:  Diagnosis Date  . Chlamydia     There are no active problems to display for this patient.   History reviewed. No pertinent surgical history.      Home Medications    Prior to Admission medications   Medication Sig Start Date End Date Taking? Authorizing Provider  ciprofloxacin (CIPRO) 500 MG tablet Take 1 tablet (500 mg total) by mouth 2 (two) times daily. One po bid x 7 days Patient not taking: Reported on 05/10/2015 05/04/15   Joycie Peek, PA-C  doxycycline (VIBRAMYCIN) 100 MG capsule Take 1 capsule (100 mg total) by mouth 2 (two) times daily. One po bid x 7 days 05/10/15   Fayrene Helper, PA-C  doxycycline (VIBRAMYCIN) 100 MG capsule Take 1 capsule (100 mg total) by mouth 2 (two) times daily. One po bid x 7 days 03/21/18   Nicanor Alcon, Izek Corvino, MD    Family History No family history on file.  Social History Social History   Tobacco Use  . Smoking status: Never Smoker  Substance Use Topics  . Alcohol use: Yes    Comment: occ  . Drug use: Yes    Types: Marijuana     Allergies   Patient has no known allergies.   Review of Systems Review of Systems  Constitutional:  Negative for fever.  Respiratory: Negative for wheezing.   Cardiovascular: Negative for chest pain.  Gastrointestinal: Negative for abdominal pain.  Genitourinary: Positive for dysuria and frequency.  All other systems reviewed and are negative.    Physical Exam Updated Vital Signs BP 116/79 (BP Location: Right Arm)   Pulse 65   Temp 98.3 F (36.8 C) (Oral)   Resp 18   Ht 6' (1.829 m)   Wt 81.6 kg (180 lb)   SpO2 100%   BMI 24.41 kg/m   Physical Exam  Constitutional: He is oriented to person, place, and time. He appears well-developed and well-nourished. No distress.  HENT:  Head: Normocephalic and atraumatic.  Mouth/Throat: No oropharyngeal exudate.  Eyes: Pupils are equal, round, and reactive to light. Conjunctivae are normal.  Neck: Normal range of motion. Neck supple.  Cardiovascular: Normal rate, regular rhythm, normal heart sounds and intact distal pulses.  Pulmonary/Chest: Effort normal and breath sounds normal. No stridor. He has no wheezes. He has no rales.  Abdominal: Soft. Bowel sounds are normal. He exhibits no mass. There is no tenderness. There is no rebound and no guarding.  Musculoskeletal: Normal range of motion.  Neurological: He is alert and oriented to person, place, and time. He displays normal reflexes.  Skin: Skin is warm and dry. Capillary refill takes less than 2 seconds.  ED Treatments / Results  Labs (all labs ordered are listed, but only abnormal results are displayed) Results for orders placed or performed during the hospital encounter of 03/21/18  Urinalysis, Routine w reflex microscopic  Result Value Ref Range   Color, Urine YELLOW YELLOW   APPearance CLEAR CLEAR   Specific Gravity, Urine >1.030 (H) 1.005 - 1.030   pH 6.0 5.0 - 8.0   Glucose, UA NEGATIVE NEGATIVE mg/dL   Hgb urine dipstick NEGATIVE NEGATIVE   Bilirubin Urine NEGATIVE NEGATIVE   Ketones, ur NEGATIVE NEGATIVE mg/dL   Protein, ur NEGATIVE NEGATIVE mg/dL   Nitrite  NEGATIVE NEGATIVE   Leukocytes, UA MODERATE (A) NEGATIVE  Urinalysis, Microscopic (reflex)  Result Value Ref Range   RBC / HPF NONE SEEN 0 - 5 RBC/hpf   WBC, UA 11-20 0 - 5 WBC/hpf   Bacteria, UA RARE (A) NONE SEEN   Squamous Epithelial / LPF 0-5 0 - 5   No results found.  EKG None  Radiology No results found.  Procedures Procedures (including critical care time)  Medications Ordered in ED Medications  cefTRIAXone (ROCEPHIN) injection 1 g (1 g Intramuscular Given 03/21/18 0114)  azithromycin (ZITHROMAX) powder 1 g (1 g Oral Given 03/21/18 0114)       Final Clinical Impressions(s) / ED Diagnoses   Final diagnoses:  Urethritis  Sexually transmitted disease (STD)  Return for weakness, numbness, changes in vision or speech, fevers >100.4 unrelieved by medication, shortness of breath, intractable vomiting, or diarrhea, abdominal pain, Inability to tolerate liquids or food, cough, altered mental status or any concerns. No signs of systemic illness or infection. The patient is nontoxic-appearing on exam and vital signs are within normal limits.   I have reviewed the triage vital signs and the nursing notes. Pertinent labs &imaging results that were available during my care of the patient were reviewed by me and considered in my medical decision making (see chart for details).  After history, exam, and medical workup I feel the patient has been appropriately medically screened and is safe for discharge home. Pertinent diagnoses were discussed with the patient. Patient was given return precautions.    ED Discharge Orders        Ordered    doxycycline (VIBRAMYCIN) 100 MG capsule  2 times daily     03/21/18 0102       Infiniti Hoefling, MD 03/21/18 [phone]

## 2018-03-21 NOTE — ED Notes (Signed)
Pt verbalizes understanding of d/c instructions and denies any further need at this time. 

## 2018-03-24 LAB — GC/CHLAMYDIA PROBE AMP (~~LOC~~) NOT AT ARMC
Chlamydia: NEGATIVE
Neisseria Gonorrhea: NEGATIVE
